# Patient Record
Sex: Male | Born: 2000 | Race: White | Hispanic: No | Marital: Single | Smoking: Former smoker
Health system: Southern US, Community
[De-identification: ages and names within clinical notes are randomized; demographics above are authoritative.]

## PROBLEM LIST (undated history)

## (undated) HISTORY — PX: SKIN GRAFT: SHX250

## (undated) HISTORY — PX: FRACTURE SURGERY: SHX138

---
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
## 2019-08-05 DIAGNOSIS — Z20822 Contact with and (suspected) exposure to covid-19: Secondary | ICD-10-CM | POA: Diagnosis not present

## 2021-09-25 DIAGNOSIS — Z5321 Procedure and treatment not carried out due to patient leaving prior to being seen by health care provider: Secondary | ICD-10-CM | POA: Diagnosis not present

## 2021-09-25 DIAGNOSIS — R109 Unspecified abdominal pain: Secondary | ICD-10-CM | POA: Diagnosis not present

## 2021-09-25 NOTE — ED Triage Notes (Signed)
Pt comes from home via POV left sided abd pain that started today. Pt states pain comes and goes. Pt started throwing up yesterday, unable to keep anything down.

## 2021-09-26 ENCOUNTER — Emergency Department
Admission: EM | Admit: 2021-09-26 | Discharge: 2021-09-26 | Disposition: A | Discharge status: Home / Self Care | Payer: Medicaid Other | Attending: Emergency Medicine | Admitting: Emergency Medicine

## 2021-09-26 LAB — COMPREHENSIVE METABOLIC PANEL
ALT: 17 U/L (ref 0–44)
AST: 25 U/L (ref 15–41)
Albumin: 5.3 g/dL — ABNORMAL HIGH (ref 3.5–5.0)
Alkaline Phosphatase: 92 U/L (ref 38–126)
Anion gap: 12 (ref 5–15)
BUN: 14 mg/dL (ref 6–20)
CO2: 28 mmol/L (ref 22–32)
Calcium: 10.3 mg/dL (ref 8.9–10.3)
Chloride: 93 mmol/L — ABNORMAL LOW (ref 98–111)
Creatinine, Ser: 0.87 mg/dL (ref 0.61–1.24)
GFR, Estimated: >60 mL/min (ref >60)
Glucose, Bld: 129 mg/dL — ABNORMAL HIGH (ref 70–99)
Potassium: 3.7 mmol/L (ref 3.5–5.1)
Sodium: 133 mmol/L — ABNORMAL LOW (ref 135–145)
Total Bilirubin: 3.3 mg/dL — ABNORMAL HIGH (ref 0.3–1.2)
Total Protein: 9.4 g/dL — ABNORMAL HIGH (ref 6.5–8.1)

## 2021-09-26 LAB — URINALYSIS, ROUTINE W REFLEX MICROSCOPIC
Bacteria, UA: NONE SEEN
Bilirubin Urine: NEGATIVE
Glucose, UA: NEGATIVE mg/dL
Ketones, ur: NEGATIVE mg/dL
Leukocytes,Ua: NEGATIVE
Nitrite: NEGATIVE
Protein, ur: 100 mg/dL — AB
Specific Gravity, Urine: 1.019 (ref 1.005–1.030)
Squamous Epithelial / LPF: NONE SEEN (ref 0–5)
pH: 6 (ref 5.0–8.0)

## 2021-09-26 LAB — CBC
HCT: 50.3 % (ref 39.0–52.0)
Hemoglobin: 17.1 g/dL — ABNORMAL HIGH (ref 13.0–17.0)
MCH: 29.6 pg (ref 26.0–34.0)
MCHC: 34 g/dL (ref 30.0–36.0)
MCV: 87.2 fL (ref 80.0–100.0)
Platelets: 383 10*3/uL (ref 150–400)
RBC: 5.77 MIL/uL (ref 4.22–5.81)
RDW: 12.8 % (ref 11.5–15.5)
WBC: 11.6 10*3/uL — ABNORMAL HIGH (ref 4.0–10.5)
nRBC: 0 % (ref 0.0–0.2)

## 2021-09-26 LAB — LIPASE, BLOOD: Lipase: 27 U/L (ref 11–51)

## 2021-09-30 ENCOUNTER — Emergency Department: Payer: Medicaid Other

## 2021-09-30 ENCOUNTER — Encounter: Payer: Self-pay | Admitting: Internal Medicine

## 2021-09-30 ENCOUNTER — Observation Stay
Admission: EM | Admit: 2021-09-30 | Discharge: 2021-10-01 | Disposition: A | Discharge status: Home / Self Care | Payer: Medicaid Other | Attending: Internal Medicine | Admitting: Internal Medicine

## 2021-09-30 ENCOUNTER — Other Ambulatory Visit: Payer: Self-pay

## 2021-09-30 DIAGNOSIS — Z20822 Contact with and (suspected) exposure to covid-19: Secondary | ICD-10-CM | POA: Insufficient documentation

## 2021-09-30 DIAGNOSIS — R935 Abnormal findings on diagnostic imaging of other abdominal regions, including retroperitoneum: Secondary | ICD-10-CM | POA: Diagnosis not present

## 2021-09-30 DIAGNOSIS — E876 Hypokalemia: Secondary | ICD-10-CM | POA: Insufficient documentation

## 2021-09-30 DIAGNOSIS — K828 Other specified diseases of gallbladder: Secondary | ICD-10-CM | POA: Diagnosis not present

## 2021-09-30 DIAGNOSIS — R17 Unspecified jaundice: Secondary | ICD-10-CM | POA: Diagnosis not present

## 2021-09-30 DIAGNOSIS — R1110 Vomiting, unspecified: Secondary | ICD-10-CM | POA: Diagnosis present

## 2021-09-30 DIAGNOSIS — R112 Nausea with vomiting, unspecified: Secondary | ICD-10-CM | POA: Diagnosis present

## 2021-09-30 DIAGNOSIS — R109 Unspecified abdominal pain: Secondary | ICD-10-CM | POA: Insufficient documentation

## 2021-09-30 DIAGNOSIS — K824 Cholesterolosis of gallbladder: Secondary | ICD-10-CM | POA: Diagnosis not present

## 2021-09-30 DIAGNOSIS — R079 Chest pain, unspecified: Secondary | ICD-10-CM | POA: Diagnosis not present

## 2021-09-30 LAB — URINALYSIS, ROUTINE W REFLEX MICROSCOPIC
Bilirubin Urine: NEGATIVE
Glucose, UA: NEGATIVE mg/dL
Hgb urine dipstick: NEGATIVE
Ketones, ur: NEGATIVE mg/dL
Leukocytes,Ua: NEGATIVE
Nitrite: NEGATIVE
Protein, ur: 100 mg/dL — AB
Specific Gravity, Urine: 1.027 (ref 1.005–1.030)
Squamous Epithelial / LPF: NONE SEEN (ref 0–5)
pH: 6 (ref 5.0–8.0)

## 2021-09-30 LAB — PROTIME-INR
INR: 1.1 (ref 0.8–1.2)
Prothrombin Time: 14.3 seconds (ref 11.4–15.2)

## 2021-09-30 LAB — CBC
HCT: 49.2 % (ref 39.0–52.0)
Hemoglobin: 17 g/dL (ref 13.0–17.0)
MCH: 29.4 pg (ref 26.0–34.0)
MCHC: 34.6 g/dL (ref 30.0–36.0)
MCV: 85.1 fL (ref 80.0–100.0)
Platelets: 407 10*3/uL — ABNORMAL HIGH (ref 150–400)
RBC: 5.78 MIL/uL (ref 4.22–5.81)
RDW: 12.1 % (ref 11.5–15.5)
WBC: 10.4 10*3/uL (ref 4.0–10.5)
nRBC: 0 % (ref 0.0–0.2)

## 2021-09-30 LAB — HEPATIC FUNCTION PANEL
ALT: 14 U/L (ref 0–44)
AST: 32 U/L (ref 15–41)
Albumin: 4.3 g/dL (ref 3.5–5.0)
Alkaline Phosphatase: 71 U/L (ref 38–126)
Bilirubin, Direct: 0.5 mg/dL — ABNORMAL HIGH (ref 0.0–0.2)
Indirect Bilirubin: 5 mg/dL — ABNORMAL HIGH (ref 0.3–0.9)
Total Bilirubin: 5.5 mg/dL — ABNORMAL HIGH (ref 0.3–1.2)
Total Protein: 7.2 g/dL (ref 6.5–8.1)

## 2021-09-30 LAB — HEPATITIS PANEL, ACUTE
HCV Ab: NONREACTIVE
Hep A IgM: NONREACTIVE
Hep B C IgM: NONREACTIVE
Hepatitis B Surface Ag: NONREACTIVE

## 2021-09-30 LAB — COMPREHENSIVE METABOLIC PANEL
ALT: 19 U/L (ref 0–44)
AST: 38 U/L (ref 15–41)
Albumin: 5.2 g/dL — ABNORMAL HIGH (ref 3.5–5.0)
Alkaline Phosphatase: 85 U/L (ref 38–126)
Anion gap: 13 (ref 5–15)
BUN: 19 mg/dL (ref 6–20)
CO2: 30 mmol/L (ref 22–32)
Calcium: 9.7 mg/dL (ref 8.9–10.3)
Chloride: 88 mmol/L — ABNORMAL LOW (ref 98–111)
Creatinine, Ser: 1.05 mg/dL (ref 0.61–1.24)
GFR, Estimated: >60 mL/min (ref >60)
Glucose, Bld: 107 mg/dL — ABNORMAL HIGH (ref 70–99)
Potassium: 2.6 mmol/L — CL (ref 3.5–5.1)
Sodium: 131 mmol/L — ABNORMAL LOW (ref 135–145)
Total Bilirubin: 6.5 mg/dL — ABNORMAL HIGH (ref 0.3–1.2)
Total Protein: 8.8 g/dL — ABNORMAL HIGH (ref 6.5–8.1)

## 2021-09-30 LAB — SARS CORONAVIRUS 2 BY RT PCR: SARS Coronavirus 2 by RT PCR: NEGATIVE

## 2021-09-30 LAB — LIPASE, BLOOD: Lipase: 43 U/L (ref 11–51)

## 2021-09-30 LAB — MAGNESIUM: Magnesium: 2.4 mg/dL (ref 1.7–2.4)

## 2021-09-30 LAB — CK: Total CK: 284 U/L (ref 49–397)

## 2021-09-30 MED ORDER — POTASSIUM CHLORIDE 10 MEQ/100ML IV SOLN
10.0000 meq | INTRAVENOUS | Status: AC
  Administered 2021-09-30 (×2): 10 meq via INTRAVENOUS
  Filled 2021-09-30 (×2): qty 100

## 2021-09-30 MED ORDER — ACETAMINOPHEN 325 MG PO TABS
650.0000 mg | ORAL_TABLET | Freq: Four times a day (QID) | ORAL | Status: DC | PRN
  Administered 2021-10-01: 650 mg via ORAL
  Filled 2021-09-30: qty 2

## 2021-09-30 MED ORDER — ACETAMINOPHEN 500 MG PO TABS
1000.0000 mg | ORAL_TABLET | Freq: Once | ORAL | Status: AC
  Administered 2021-09-30: 1000 mg via ORAL
  Filled 2021-09-30: qty 2

## 2021-09-30 MED ORDER — ONDANSETRON HCL 4 MG/2ML IJ SOLN
4.0000 mg | Freq: Four times a day (QID) | INTRAMUSCULAR | Status: DC | PRN
  Administered 2021-10-01: 4 mg via INTRAVENOUS
  Filled 2021-09-30: qty 2

## 2021-09-30 MED ORDER — SODIUM CHLORIDE 0.9 % IV BOLUS
1000.0000 mL | Freq: Once | INTRAVENOUS | Status: AC
  Administered 2021-09-30: 1000 mL via INTRAVENOUS

## 2021-09-30 MED ORDER — SODIUM CHLORIDE 0.9 % IV SOLN: INTRAVENOUS | Status: DC

## 2021-09-30 MED ORDER — ACETAMINOPHEN 650 MG RE SUPP: 650.0000 mg | Freq: Four times a day (QID) | RECTAL | Status: DC | PRN

## 2021-09-30 MED ORDER — LIDOCAINE 5 % EX PTCH
1.0000 | MEDICATED_PATCH | Freq: Once | CUTANEOUS | Status: AC
  Administered 2021-09-30: 1 via TRANSDERMAL
  Filled 2021-09-30: qty 1

## 2021-09-30 MED ORDER — POTASSIUM CHLORIDE CRYS ER 20 MEQ PO TBCR
40.0000 meq | EXTENDED_RELEASE_TABLET | Freq: Two times a day (BID) | ORAL | Status: AC
  Administered 2021-09-30 (×2): 40 meq via ORAL
  Filled 2021-09-30 (×2): qty 2

## 2021-09-30 MED ORDER — ONDANSETRON 4 MG PO TBDP
4.0000 mg | ORAL_TABLET | Freq: Once | ORAL | Status: AC
  Administered 2021-09-30: 4 mg via ORAL
  Filled 2021-09-30: qty 1

## 2021-09-30 MED ORDER — GADOBUTROL 1 MMOL/ML IV SOLN
7.0000 mL | Freq: Once | INTRAVENOUS | Status: AC | PRN
  Administered 2021-09-30: 7 mL via INTRAVENOUS

## 2021-09-30 MED ORDER — ONDANSETRON HCL 4 MG/2ML IJ SOLN: 4.0000 mg | Freq: Once | INTRAMUSCULAR | Status: DC

## 2021-09-30 MED ORDER — ONDANSETRON HCL 4 MG PO TABS: 4.0000 mg | ORAL_TABLET | Freq: Four times a day (QID) | ORAL | Status: DC | PRN

## 2021-09-30 NOTE — H&P (Signed)
History and Physical    Patient: Richard Wells VQQ:[id number] DOB: 11-12-2000 DOA: 09/30/2021 DOS: the patient was seen and examined on 09/30/2021 PCP: Pcp, No  Patient coming from: Home  Chief Complaint:  Chief Complaint  Patient presents with   Back Pain   HPI: Richard Wells is a 21 y.o. male with no significant past medical history who presented to the ER for evaluation of left flank pain.  Patient states that he woke up this morning with severe pain in the left lumbar area.  He rated his pain an 8 x 10 in intensity at its worst.  Pain was nonradiating and he denied having any hematuria, no frequency or dysuria.  He denied having any trauma.  He had 2 episodes of emesis prompting his visit to the emergency room. Patient presented to the ER about 5 days prior to this admission for evaluation of abdominal pain, nausea and vomiting but left without being seen.  His symptoms have since resolved and he attributed it to heat exhaustion. He has not had any further episodes of emesis since being in the ER.  His left flank pain has resolved.  He denies having any chest pain, no shortness of breath, no headache, no fever, no chills, no cough, no leg swelling, no blurred vision no focal deficit. Labs revealed a potassium level of 2.6, sodium of 131, chloride 88 as well as total bilirubin of 6.5 compared to 3.3, 5 days ago He will be referred to observation status for further evaluation.    Review of Systems: As mentioned in the history of present illness. All other systems reviewed and are negative. No past medical history on file. Past Surgical History:  Procedure Laterality Date   SKIN GRAFT     Social History:  reports that he has never smoked. He does not have any smokeless tobacco history on file. No history on file for alcohol use and drug use.  Allergies  Allergen Reactions   Penicillins Hives    No family history on file.  Prior to Admission medications   Not on File     Physical Exam: Vitals:   09/30/21 0712 09/30/21 1304  BP: (!) 135/110 116/71  Pulse: 92 64  Resp: 18 16  Temp: 98.5 F (36.9 C) 98.5 F (36.9 C)  TempSrc: Oral Oral  SpO2: 93% 95%  Weight: 72.6 kg   Height: 5\' 9"  (1.753 m)    Physical Exam Vitals and nursing note reviewed.  Constitutional:      Appearance: Normal appearance.     Comments: Appears comfortable and in no distress  HENT:     Head: Normocephalic.     Nose: Nose normal.     Mouth/Throat:     Mouth: Mucous membranes are moist.  Eyes:     General: Scleral icterus present.  Cardiovascular:     Rate and Rhythm: Normal rate and regular rhythm.  Pulmonary:     Effort: Pulmonary effort is normal.     Breath sounds: Normal breath sounds.  Abdominal:     General: Abdomen is flat. Bowel sounds are normal.     Palpations: Abdomen is soft.  Musculoskeletal:        General: Normal range of motion.     Cervical back: Normal range of motion and neck supple.  Skin:    General: Skin is warm and dry.  Neurological:     General: No focal deficit present.     Mental Status: He is alert and oriented  to person, place, and time.  Psychiatric:        Mood and Affect: Mood normal.        Behavior: Behavior normal.     Data Reviewed: Relevant notes from primary care and specialist visits, past discharge summaries as available in EHR, including Care Everywhere. Prior diagnostic testing as pertinent to current admission diagnoses Updated medications and problem lists for reconciliation ED course, including vitals, labs, imaging, treatment and response to treatment Triage notes, nursing and pharmacy notes and ED provider's notes Notable results as noted in HPI Labs reviewed.  Total protein 7.2, albumin 4.3, AST 32, ALT 14, alkaline phosphatase 71, total bilirubin 5.5, indirect bilirubin 5.0, total CK2 84, magnesium 2.4, lipase 43, sodium 131, potassium 2.6, chloride 88, bicarb 30, glucose 107, BUN 19, creatinine 1.05,  calcium 9.7, total protein 8.8, albumin 5.2, AST 38, ALT 19, alkaline phosphatase 85, total bilirubin 6.5, white count 10.4, hemoglobin 17, hematocrit 49.2, platelet count 4 7 Renal stone CT shows No acute or active process within the abdomen or pelvis. Chest x-ray reviewed by me shows No active cardiopulmonary disease. Liver ultrasound shows distended gallbladder containing biliary sludge. This suggests cholestasis. There is no evidence of associated biliary ductal dilatation. Elective correlation with MRI of the abdomen with and without contrast and with MRCP may be helpful given elevated bilirubin. The liver parenchyma shows no overt evidence parenchymal disease by ultrasound. Focal echogenic area in the subcapsular right lobe is suggestive of a hemangioma by ultrasound versus focal fat deposition. MRCP shows Mild distension of the gallbladder without signs of cholecystitis.Mild sludge in the gallbladder lumen. No signs of cholelithiasis or choledocholithiasis with normal caliber of the common bile duct and intrahepatic biliary tree. No acute findings in the abdomen. Twelve-lead EKG reviewed by me shows sinus rhythm with sinus arrhythmia There are no new results to review at this time.  Assessment and Plan: * Hyperbilirubinemia Unclear etiology and patient is asymptomatic ??  Secondary to Gilbert's syndrome No evidence of hemolysis as H&H is stable Continue IV fluid hydration and repeat bilirubin levels in a.m. Gastroenterology consult   Hypokalemia Most likely related to GI losses from nausea and vomiting Supplement potassium Magnesium levels are within normal limits      Advance Care Planning:   Code Status: Full Code   Consults: Gastroenterology  Family Communication: Greater than 50% of time was spent discussing patient's condition and plan of care with him at the bedside.  All questions and concerns have been addressed.  He verbalizes understanding and agrees with the  plan.  Severity of Illness: The appropriate patient status for this patient is OBSERVATION. Observation status is judged to be reasonable and necessary in order to provide the required intensity of service to ensure the patient's safety. The patient's presenting symptoms, physical exam findings, and initial radiographic and laboratory data in the context of their medical condition is felt to place them at decreased risk for further clinical deterioration. Furthermore, it is anticipated that the patient will be medically stable for discharge from the hospital within 2 midnights of admission.   Author: Lucile Shutters, MD 09/30/2021 3:22 PM  For on call review [email or web address].

## 2021-09-30 NOTE — Assessment & Plan Note (Signed)
Unclear etiology and patient is asymptomatic ??  Secondary to Gilbert's syndrome No evidence of hemolysis as H&H is stable Continue IV fluid hydration and repeat bilirubin levels in a.m. Gastroenterology consult

## 2021-09-30 NOTE — ED Notes (Signed)
Pt returned from MRI °

## 2021-09-30 NOTE — ED Provider Notes (Signed)
[facility] Provider Note    Event Date/Time   First MD Initiated Contact with Patient 09/30/21 0715     (approximate)   History   Back Pain   HPI  Richard Wells is a 21 y.o. male otherwise healthy who comes in with left flank pain.  Patient reports left flank pain since yesterday with associated nausea and vomiting.  He denies any diarrheal symptoms.  He reports that over a week ago he was out in the sun and working outside a lot but he has been drinking plenty of water since then but was not sure if this could just be heat exhaustion but denies any recurrent events of being out in the sun recently.  He denies any shortness of breath, chest pain, coughing.  Patient's oxygen level was noted to be 93% in triage but when I placed him on the O2 monitor he was 95%.  He does report history of vaping but denies any shortness of breath.  Patient denies taking anything to help with the pain today.  He did take some Advil yesterday   Physical Exam   Triage Vital Signs: ED Triage Vitals [09/30/21 0712]  Enc Vitals Group     BP (!) 135/110     Pulse Rate 92     Resp 18     Temp 98.5 F (36.9 C)     Temp Source Oral     SpO2 93 %     Weight 160 lb 0.9 oz (72.6 kg)     Height 5\' 9"  (1.753 m)     Head Circumference      Peak Flow      Pain Score 5     Pain Loc      Pain Edu?      Excl. in GC?     Most recent vital signs: Vitals:   09/30/21 0712  BP: (!) 135/110  Pulse: 92  Resp: 18  Temp: 98.5 F (36.9 C)  SpO2: 93%     General: Awake, no distress.  CV:  Good peripheral perfusion.  Resp:  Normal effort.  Abd:  No distention.  Left CVA tenderness.  Abdomen is soft and nontender otherwise Other:     ED Results / Procedures / Treatments   Labs (all labs ordered are listed, but only abnormal results are displayed) Labs Reviewed  CBC  URINALYSIS, ROUTINE W REFLEX MICROSCOPIC  COMPREHENSIVE METABOLIC PANEL  CK     EKG  My  interpretation of EKG:  Normal sinus rate of 66 without any ST elevation or T wave inversions, sinus arrhythmia, normal intervals  RADIOLOGY I have reviewed the xray personally and interpreted and no evidence of any pneumonia.  PROCEDURES:  Critical Care performed: No  Procedures   MEDICATIONS ORDERED IN ED: Medications  sodium chloride 0.9 % bolus 1,000 mL (has no administration in time range)  ondansetron (ZOFRAN) injection 4 mg (has no administration in time range)  acetaminophen (TYLENOL) tablet 1,000 mg (has no administration in time range)  lidocaine (LIDODERM) 5 % 1 patch (has no administration in time range)     IMPRESSION / MDM / ASSESSMENT AND PLAN / ED COURSE  I reviewed the triage vital signs and the nursing notes.  Differential includes UTI, pyelonephritis, kidney stone.  Will get urine and CT scan to evaluate for kidney stone given the left flank tenderness.  Patient's oxygen level noted to be 93% in triage but when I checked it it was 95%  without any interventions and within 10 minutes of initial triage.  Does report vaping history but denies any chest pain, shortness of breath..  We will add on COVID test, chest x-ray.  Will get labs to evaluate for any dehydration, rhabdo this seems less likely given the heat exposure was over a week ago.  We will treat patient with some fluids, Zofran, Tylenol, lidocaine patch   CT renal and x-ray are negative.  Labs show low sodium, low potassium and low chloride and patient's total bilirubin is elevated but has normal LFTs.  Patient's labs from 5 days ago showed a total bilirubin of 3.3 so it is doubled over the past few days.  He reports at that time he come into the emergency room for left lower quadrant abdominal pain without has since all resolved.  He denies any issues with his liver that he is noted in the past.  He denies any alcohol use other than occasional.  Denies any IV drug use.  Does report occasional marijuana use in  the past as well as vaping currently but denies any other drugs.  Urine looked amber in color but was overall reassuring no evidence of UTI but does have a lot of protein in his urine magnesium normal CK normal lipase normal  Discussed with Dr. Tobi Bastos GI who recommended MRI.  This is a little bit higher level than 1 would expect from Gilbert's disease so agree with admission for IV fluids, IV potassium and monitoring patient's bilirubin  I discussed the hospital team for admission  Patient's presentation is most consistent with acute presentation with potential threat to life or bodily function.    FINAL CLINICAL IMPRESSION(S) / ED DIAGNOSES   Final diagnoses:  Elevated bilirubin  Hypokalemia     Rx / DC Orders   ED Discharge Orders     None        Note:  This document was prepared using Dragon voice recognition software and may include unintentional dictation errors.   Concha Se, MD 09/30/21 [phone]

## 2021-09-30 NOTE — ED Triage Notes (Signed)
Pt here with back pain and possible heat exhaustion. Pt states yesterday he began having back pain from the lumbar region up. Pt states he was not out side and he has been drinking water but believes he may be dehydrated. Pt also states he has been nauseous and vomiting.

## 2021-09-30 NOTE — ED Notes (Signed)
Patient transported to MRI 

## 2021-09-30 NOTE — Assessment & Plan Note (Signed)
Most likely related to GI losses from nausea and vomiting Supplement potassium Magnesium levels are within normal limits

## 2021-10-01 DIAGNOSIS — R1032 Left lower quadrant pain: Secondary | ICD-10-CM | POA: Diagnosis not present

## 2021-10-01 DIAGNOSIS — R1110 Vomiting, unspecified: Secondary | ICD-10-CM | POA: Diagnosis present

## 2021-10-01 DIAGNOSIS — R1111 Vomiting without nausea: Secondary | ICD-10-CM

## 2021-10-01 DIAGNOSIS — E876 Hypokalemia: Secondary | ICD-10-CM

## 2021-10-01 DIAGNOSIS — R109 Unspecified abdominal pain: Secondary | ICD-10-CM | POA: Diagnosis present

## 2021-10-01 DIAGNOSIS — R17 Unspecified jaundice: Secondary | ICD-10-CM | POA: Diagnosis not present

## 2021-10-01 LAB — LACTATE DEHYDROGENASE: LDH: 91 U/L — ABNORMAL LOW (ref 98–192)

## 2021-10-01 LAB — COMPREHENSIVE METABOLIC PANEL WITH GFR
ALT: 15 U/L (ref 0–44)
AST: 23 U/L (ref 15–41)
Albumin: 3.9 g/dL (ref 3.5–5.0)
Alkaline Phosphatase: 65 U/L (ref 38–126)
Anion gap: 0 — ABNORMAL LOW (ref 5–15)
BUN: 9 mg/dL (ref 6–20)
CO2: 27 mmol/L (ref 22–32)
Calcium: 8.6 mg/dL — ABNORMAL LOW (ref 8.9–10.3)
Chloride: 108 mmol/L (ref 98–111)
Creatinine, Ser: 0.83 mg/dL (ref 0.61–1.24)
GFR, Estimated: >60 mL/min (ref >60)
Glucose, Bld: 94 mg/dL (ref 70–99)
Potassium: 3.7 mmol/L (ref 3.5–5.1)
Sodium: 135 mmol/L (ref 135–145)
Total Bilirubin: 2.9 mg/dL — ABNORMAL HIGH (ref 0.3–1.2)
Total Protein: 6.7 g/dL (ref 6.5–8.1)

## 2021-10-01 LAB — CBC
HCT: 39.8 % (ref 39.0–52.0)
Hemoglobin: 13.6 g/dL (ref 13.0–17.0)
MCH: 30.3 pg (ref 26.0–34.0)
MCHC: 34.2 g/dL (ref 30.0–36.0)
MCV: 88.6 fL (ref 80.0–100.0)
Platelets: 263 10*3/uL (ref 150–400)
RBC: 4.49 MIL/uL (ref 4.22–5.81)
RDW: 12.5 % (ref 11.5–15.5)
WBC: 7.4 10*3/uL (ref 4.0–10.5)
nRBC: 0 % (ref 0.0–0.2)

## 2021-10-01 LAB — HIV ANTIBODY (ROUTINE TESTING W REFLEX): HIV Screen 4th Generation wRfx: NONREACTIVE

## 2021-10-01 LAB — BILIRUBIN, DIRECT: Bilirubin, Direct: 0.4 mg/dL — ABNORMAL HIGH (ref 0.0–0.2)

## 2021-10-01 MED ORDER — POTASSIUM CHLORIDE IN NACL 20-0.9 MEQ/L-% IV SOLN
INTRAVENOUS | Status: DC
  Filled 2021-10-01: qty 1000

## 2021-10-01 MED ORDER — SODIUM CHLORIDE 0.9 % IV SOLN
12.5000 mg | Freq: Four times a day (QID) | INTRAVENOUS | Status: DC | PRN
  Administered 2021-10-01: 12.5 mg via INTRAVENOUS
  Filled 2021-10-01: qty 12.5

## 2021-10-01 NOTE — Discharge Summary (Addendum)
Physician Discharge Summary   Patient: Richard Wells MRN: [id number] DOB: 2000-09-12  Admit date:     09/30/2021  Discharge date: 10/01/2021  Discharge Physician: Lurene Shadow   PCP: Pcp, No   Recommendations at discharge:   Follow-up with PCP in 1 to 2 weeks  Discharge Diagnoses: Principal Problem:   Hyperbilirubinemia Active Problems:   Hypokalemia   Vomiting   Abdominal pain  Resolved Problems:   * No resolved hospital problems. [facility] Course:  Mr. Husam Hohn is a 21 year old man who recently presented to the emergency department on 09/25/2021 with nausea, vomiting and abdominal pain.  However, he left the emergency department without being seen by a physician.  He presented again to the emergency department on 09/30/2021 with abdominal pain/left flank pain and vomiting.    Work-up revealed hyperbilirubinemia and hypokalemia.  He was treated with IV fluids, analgesics and antiemetics.  Potassium was repleted successfully.  Abdominal imaging including MRCP did not show any abnormality that would explain his hyperbilirubinemia.  He was evaluated by the gastroenterologist.  No further evaluation was recommended from gastroenterologist's standpoint.  Bilirubin level has improved.  Differential diagnosis for hyperbilirubinemia include Gilbert's syndrome. Discharge plan discussed with patient and her family at the bedside.         Consultants: Gastroenterologist Procedures performed: None  Disposition: Home Diet recommendation:  Discharge Diet Orders (From admission, onward)     Start     Ordered   10/01/21 0000  Diet general        10/01/21 1505           Regular diet DISCHARGE MEDICATION: Allergies as of 10/01/2021       Reactions   Penicillins Hives        Medication List    You have not been prescribed any medications.     Discharge Exam: Filed Weights   09/30/21 5462  Weight: 72.6 kg   GEN: NAD SKIN: Warm and dry EYES: icteric, no  pallor ENT: MMM CV: RRR PULM: CTA B ABD: soft, ND, NT, +BS CNS: AAO x 3, non focal EXT: No edema or tenderness   Condition at discharge: good  The results of significant diagnostics from this hospitalization (including imaging, microbiology, ancillary and laboratory) are listed below for reference.   Imaging Studies: MR ABDOMEN MRCP W WO CONTAST  Result Date: 09/30/2021 CLINICAL DATA:  Distended gallbladder on recent ultrasound and CT imaging. EXAM: MRI ABDOMEN WITHOUT AND WITH CONTRAST (INCLUDING MRCP) TECHNIQUE: Multiplanar multisequence MR imaging of the abdomen was performed both before and after the administration of intravenous contrast. Heavily T2-weighted images of the biliary and pancreatic ducts were obtained, and three-dimensional MRCP images were rendered by post processing. CONTRAST:  7mL GADAVIST GADOBUTROL 1 MMOL/ML IV SOLN COMPARISON:  CT of September 30, 2021 and ultrasound also performed on September 30, 2021 FINDINGS: Lower chest: No effusion.  No consolidative changes. Hepatobiliary: Smooth hepatic contours. Mild distension of the gallbladder without wall thickening, pericholecystic stranding or pericholecystic fluid. Mild sludge in the gallbladder lumen. No biliary duct dilation. No signs of choledocholithiasis. No substantial fat or iron deposition in the patent parenchyma. No focal, suspicious hepatic lesion. Portal vein is patent. No correlate for the small echogenic area the RIGHT hemiliver, presumably subtle focal fat deposition. Pancreas: Normal, without mass, inflammation or ductal dilatation. MRI Spleen:  Normal. Adrenals/Urinary Tract: Adrenal glands are normal. Kidneys with symmetric renal enhancement, no hydronephrosis pack, perinephric stranding or suspicious renal lesion. Stomach/Bowel: Unremarkable to the extent  evaluated on abdominal MRI. Vascular/Lymphatic: No pathologically enlarged lymph nodes identified. No abdominal aortic aneurysm demonstrated. Other:  No  ascites. Musculoskeletal: No suspicious bone lesions identified. IMPRESSION: 1. Mild distension of the gallbladder without signs of cholecystitis. 2. Mild sludge in the gallbladder lumen. No signs of cholelithiasis or choledocholithiasis with normal caliber of the common bile duct and intrahepatic biliary tree. 3. No acute findings in the abdomen. Electronically Signed   By: Donzetta Kohut M.D.   On: 09/30/2021 14:36   MR 3D Recon At Scanner  Result Date: 09/30/2021 CLINICAL DATA:  Distended gallbladder on recent ultrasound and CT imaging. EXAM: MRI ABDOMEN WITHOUT AND WITH CONTRAST (INCLUDING MRCP) TECHNIQUE: Multiplanar multisequence MR imaging of the abdomen was performed both before and after the administration of intravenous contrast. Heavily T2-weighted images of the biliary and pancreatic ducts were obtained, and three-dimensional MRCP images were rendered by post processing. CONTRAST:  27mL GADAVIST GADOBUTROL 1 MMOL/ML IV SOLN COMPARISON:  CT of September 30, 2021 and ultrasound also performed on September 30, 2021 FINDINGS: Lower chest: No effusion.  No consolidative changes. Hepatobiliary: Smooth hepatic contours. Mild distension of the gallbladder without wall thickening, pericholecystic stranding or pericholecystic fluid. Mild sludge in the gallbladder lumen. No biliary duct dilation. No signs of choledocholithiasis. No substantial fat or iron deposition in the patent parenchyma. No focal, suspicious hepatic lesion. Portal vein is patent. No correlate for the small echogenic area the RIGHT hemiliver, presumably subtle focal fat deposition. Pancreas: Normal, without mass, inflammation or ductal dilatation. MRI Spleen:  Normal. Adrenals/Urinary Tract: Adrenal glands are normal. Kidneys with symmetric renal enhancement, no hydronephrosis pack, perinephric stranding or suspicious renal lesion. Stomach/Bowel: Unremarkable to the extent evaluated on abdominal MRI. Vascular/Lymphatic: No pathologically  enlarged lymph nodes identified. No abdominal aortic aneurysm demonstrated. Other:  No ascites. Musculoskeletal: No suspicious bone lesions identified. IMPRESSION: 1. Mild distension of the gallbladder without signs of cholecystitis. 2. Mild sludge in the gallbladder lumen. No signs of cholelithiasis or choledocholithiasis with normal caliber of the common bile duct and intrahepatic biliary tree. 3. No acute findings in the abdomen. Electronically Signed   By: Donzetta Kohut M.D.   On: 09/30/2021 14:36   US ABDOMEN LIMITED RUQ (LIVER/GB)  Result Date: 09/30/2021 CLINICAL DATA:  Elevated bilirubin. EXAM: ULTRASOUND ABDOMEN LIMITED RIGHT UPPER QUADRANT COMPARISON:  None Available. FINDINGS: Gallbladder: No gallstones or wall thickening visualized. There is some biliary sludge in the gallbladder lumen. The gallbladder is distended. No sonographic Murphy sign noted by sonographer. Common bile duct: Diameter: 4 mm Liver: Parenchymal echogenicity is within normal limits. Focal subcapsular echogenic region in the right lobe measuring approximately 1.5 x 1.2 x 1.2 cm is suggestive of hemangioma by ultrasound versus focal fat deposition. No intrahepatic biliary ductal dilatation. Portal vein is patent on color Doppler imaging with normal direction of blood flow towards the liver. Other: No ascites in the right upper quadrant. IMPRESSION: 1. Distended gallbladder containing biliary sludge. This suggests cholestasis. There is no evidence of associated biliary ductal dilatation. Elective correlation with MRI of the abdomen with and without contrast and with MRCP may be helpful given elevated bilirubin. 2. The liver parenchyma shows no overt evidence parenchymal disease by ultrasound. Focal echogenic area in the subcapsular right lobe is suggestive of a hemangioma by ultrasound versus focal fat deposition. Electronically Signed   By: Irish Lack M.D.   On: 09/30/2021 09:38   DG Chest 2 View  Result Date:  09/30/2021 CLINICAL DATA:  Left flank pain. EXAM: CHEST -  2 VIEW COMPARISON:  September 09, 2010 FINDINGS: The heart size and mediastinal contours are within normal limits. Both lungs are clear. The visualized skeletal structures are unremarkable. IMPRESSION: No active cardiopulmonary disease. Electronically Signed   By: Aram Candela M.D.   On: 09/30/2021 07:58   CT Renal Stone Study  Result Date: 09/30/2021 CLINICAL DATA:  Flank pain. EXAM: CT ABDOMEN AND PELVIS WITHOUT CONTRAST TECHNIQUE: Multidetector CT imaging of the abdomen and pelvis was performed following the standard protocol without IV contrast. RADIATION DOSE REDUCTION: This exam was performed according to the departmental dose-optimization program which includes automated exposure control, adjustment of the mA and/or kV according to patient size and/or use of iterative reconstruction technique. COMPARISON:  May 26, 2009 FINDINGS: Lower chest: No acute abnormality. Hepatobiliary: No focal liver abnormality is seen. No gallstones, gallbladder wall thickening, or biliary dilatation. Pancreas: Unremarkable. No pancreatic ductal dilatation or surrounding inflammatory changes. Spleen: Normal in size without focal abnormality. Adrenals/Urinary Tract: Adrenal glands are unremarkable. Kidneys are normal, without renal calculi, focal lesion, or hydronephrosis. Bladder is unremarkable. Stomach/Bowel: Stomach is within normal limits. Appendix appears normal. No evidence of bowel wall thickening, distention, or inflammatory changes. Vascular/Lymphatic: No significant vascular findings are present. Subcentimeter mesenteric lymph nodes are seen within the mid abdomen. Reproductive: Prostate is unremarkable. Other: No abdominal wall hernia or abnormality. No abdominopelvic ascites. Musculoskeletal: No acute or significant osseous findings. IMPRESSION: No acute or active process within the abdomen or pelvis. Electronically Signed   By: Aram Candela M.D.   On:  09/30/2021 07:57    Microbiology: Results for orders placed or performed during the hospital encounter of 09/30/21  SARS Coronavirus 2 by RT PCR (hospital order, performed in [facility] hospital lab) *cepheid single result test*     Status: None   Collection Time: 09/30/21  8:07 AM   Specimen: Nasal Swab  Result Value Ref Range Status   SARS Coronavirus 2 by RT PCR NEGATIVE NEGATIVE Final    Comment: (NOTE) SARS-CoV-2 target nucleic acids are NOT DETECTED.  The SARS-CoV-2 RNA is generally detectable in upper and lower respiratory specimens during the acute phase of infection. The lowest concentration of SARS-CoV-2 viral copies this assay can detect is 250 copies / mL. A negative result does not preclude SARS-CoV-2 infection and should not be used as the sole basis for treatment or other patient management decisions.  A negative result may occur with improper specimen collection / handling, submission of specimen other than nasopharyngeal swab, presence of viral mutation(s) within the areas targeted by this assay, and inadequate number of viral copies (<250 copies / mL). A negative result must be combined with clinical observations, patient history, and epidemiological information.  Fact Sheet for Patients:   RoadLapTop.co.za  Fact Sheet for Healthcare Providers: http://kim-miller.com/  This test is not yet approved or  cleared by the [place] FDA and has been authorized for detection and/or diagnosis of SARS-CoV-2 by FDA under an Emergency Use Authorization (EUA).  This EUA will remain in effect (meaning this test can be used) for the duration of the COVID-19 declaration under Section 564(b)(1) of the Act, 21 U.S.C. section 360bbb-3(b)(1), unless the authorization is terminated or revoked sooner.  Performed at [facility], [address] [place], [place] [address]     Labs: CBC: Recent Labs  Lab 09/25/21 2358  09/30/21 0716 10/01/21 0414  WBC 11.6* 10.4 7.4  HGB 17.1* 17.0 13.6  HCT 50.3 49.2 39.8  MCV 87.2 85.1 88.6  PLT  383 407* 263   Basic Metabolic Panel: Recent Labs  Lab 09/25/21 2358 09/30/21 0716 10/01/21 0414  NA 133* 131* 135  K 3.7 2.6* 3.7  CL 93* 88* 108  CO2 28 30 27   GLUCOSE 129* 107* 94  BUN 14 19 9   CREATININE 0.87 1.05 0.83  CALCIUM 10.3 9.7 8.6*  MG  --  2.4  --    Liver Function Tests: Recent Labs  Lab 09/25/21 2358 09/30/21 0716 09/30/21 0830 10/01/21 0414  AST 25 38 32 23  ALT 17 19 14 15   ALKPHOS 92 85 71 65  BILITOT 3.3* 6.5* 5.5* 2.9*  PROT 9.4* 8.8* 7.2 6.7  ALBUMIN 5.3* 5.2* 4.3 3.9   CBG: No results for input(s): "GLUCAP" in the last 168 hours.  Discharge time spent: less than 30 minutes.  Signed: 10/02/21, MD [facility] 10/01/2021

## 2021-10-01 NOTE — Progress Notes (Signed)
Discharge instructions reviewed with patient. PIV removed with no complications. Questions answered. Patient chose to walk out instead of a wheelchair.

## 2021-10-01 NOTE — TOC Initial Note (Signed)
Transition of Care [facility]) - Initial/Assessment Note    Patient Details  Name: Richard Wells MRN: [id number] Date of Birth: 01-23-2000  Transition of Care [facility]) CM/SW Contact:    Chapman Fitch, RN Phone Number: 10/01/2021, 10:22 AM  Clinical Narrative:                  Transition of Care [facility]) Screening Note   Patient Details  Name: Richard Wells Date of Birth: Jan 16, 2001   [facility]) CM/SW Contact:    Chapman Fitch, RN Phone Number: 10/01/2021, 10:23 AM    Transition of Care Department (TOC) has reviewed patient and no TOC needs have been identified at this time. We will continue to monitor patient advancement through interdisciplinary progression rounds. If new patient transition needs arise, please place a TOC consult.  Patient encouraged to follow up with Medicaid to determine who his assigned PCP is         Patient Goals and CMS Choice        Expected Discharge Plan and Services                                                Prior Living Arrangements/Services                       Activities of Daily Living Home Assistive Devices/Equipment: None ADL Screening (condition at time of admission) Patient's cognitive ability adequate to safely complete daily activities?: Yes Is the patient deaf or have difficulty hearing?: No Does the patient have difficulty seeing, even when wearing glasses/contacts?: No Does the patient have difficulty concentrating, remembering, or making decisions?: No Patient able to express need for assistance with ADLs?: Yes Does the patient have difficulty dressing or bathing?: No Independently performs ADLs?: Yes (appropriate for developmental age) Does the patient have difficulty walking or climbing stairs?: No Weakness of Legs: None Weakness of Arms/Hands: None  Permission Sought/Granted                  Emotional Assessment              Admission diagnosis:   Hypokalemia [E87.6] Hyperbilirubinemia [E80.6] Elevated bilirubin [R17] Patient Active Problem List   Diagnosis Date Noted   Hyperbilirubinemia 09/30/2021   Hypokalemia 09/30/2021   PCP:  Pcp, No Pharmacy:  No Pharmacies Listed    Social Determinants of Health (SDOH) Interventions    Readmission Risk Interventions     No data to display

## 2021-10-01 NOTE — Consult Note (Signed)
Wyline Mood , MD [address], [place], [place], [id number] [address], [place], [place], [address] Phone: [phone]  Fax: [phone]  Consultation  Referring Provider:     Emergency room Primary Care Physician:  Pcp, No Primary Gastroenterologist: None         Reason for Consultation:     Hyperbilirubinemia  Date of Admission:  09/30/2021 Date of Consultation:  10/01/2021         HPI:   Richard Wells is a 21 y.o. male with no past medical problems presented to the ER with left flank pain.  He came into the ER and subsequently the pain resolved spontaneously.  I was contacted because his total bilirubin was 6.5, 6 days prior was 3.3.  Subsequently declined to 5.5 and 2.9 on admission the bilirubin was moderately in direct with a direct component of 0.5 and indirect component of 5.0 out of a total of 5.5.  The patient underwent an MRCP that showed mild distention of the gallbladder without signs of cholecystitis mild sludge in the gallbladder lumen no acute findings in the abdomen no dilation of the common bile duct or intrahepatic biliary tree, right upper quadrant ultrasound showed no evidence of liver disease.  INR was 1.1.  Hepatitis panel was negative.  Albumin was 4.3.  Presently he says he has no symptoms whatsoever or no complaints whatsoever denies any excess alcohol consumption no high risk behavior no family history of liver disease no new medications or antibiotics taken recently.  No family or personal history of hematological disorders  History reviewed. No pertinent past medical history.  Past Surgical History:  Procedure Laterality Date   SKIN GRAFT      Prior to Admission medications   Not on File    No family history on file.   Social History   Tobacco Use   Smoking status: Never    Allergies as of 09/30/2021 - Review Complete 09/30/2021  Allergen Reaction Noted   Penicillins Hives 06/01/2015    Review of Systems:    All  systems reviewed and negative except where noted in HPI.   Physical Exam:  Vital signs in last 24 hours: Temp:  [98.2 F (36.8 C)-98.5 F (36.9 C)] 98.2 F (36.8 C) (09/14 0419) Pulse Rate:  [64-71] 64 (09/14 0419) Resp:  [14-18] 18 (09/14 0419) BP: (116-147)/(70-86) 147/86 (09/14 0419) SpO2:  [95 %-100 %] 99 % (09/14 0419) Last BM Date : 09/30/21 General:   Pleasant, cooperative in NAD Head:  Normocephalic and atraumatic. Eyes:   No icterus.   Conjunctiva pink. PERRLA. Ears:  Normal auditory acuity. Neck:  Supple; no masses or thyroidomegaly Lungs: Respirations even and unlabored. Lungs clear to auscultation bilaterally.   No wheezes, crackles, or rhonchi.  Heart:  Regular rate and rhythm;  Without murmur, clicks, rubs or gallops Abdomen:  Soft, nondistended, nontender. Normal bowel sounds. No appreciable masses or hepatomegaly.  No rebound or guarding.  Neurologic:  Alert and oriented x3;  grossly normal neurologically. Psych:  Alert and cooperative. Normal affect.  LAB RESULTS: Recent Labs    09/30/21 0716 10/01/21 0414  WBC 10.4 7.4  HGB 17.0 13.6  HCT 49.2 39.8  PLT 407* 263   BMET Recent Labs    09/30/21 0716 10/01/21 0414  NA 131* 135  K 2.6* 3.7  CL 88* 108  CO2 30 27  GLUCOSE 107* 94  BUN 19 9  CREATININE 1.05 0.83  CALCIUM 9.7 8.6*  LFT Recent Labs    09/30/21 0830 10/01/21 0414  PROT 7.2 6.7  ALBUMIN 4.3 3.9  AST 32 23  ALT 14 15  ALKPHOS 71 65  BILITOT 5.5* 2.9*  BILIDIR 0.5*  --   IBILI 5.0*  --    PT/INR Recent Labs    09/30/21 0830  LABPROT 14.3  INR 1.1    STUDIES: MR ABDOMEN MRCP W WO CONTAST  Result Date: 09/30/2021 CLINICAL DATA:  Distended gallbladder on recent ultrasound and CT imaging. EXAM: MRI ABDOMEN WITHOUT AND WITH CONTRAST (INCLUDING MRCP) TECHNIQUE: Multiplanar multisequence MR imaging of the abdomen was performed both before and after the administration of intravenous contrast. Heavily T2-weighted images of the  biliary and pancreatic ducts were obtained, and three-dimensional MRCP images were rendered by post processing. CONTRAST:  43mL GADAVIST GADOBUTROL 1 MMOL/ML IV SOLN COMPARISON:  CT of September 30, 2021 and ultrasound also performed on September 30, 2021 FINDINGS: Lower chest: No effusion.  No consolidative changes. Hepatobiliary: Smooth hepatic contours. Mild distension of the gallbladder without wall thickening, pericholecystic stranding or pericholecystic fluid. Mild sludge in the gallbladder lumen. No biliary duct dilation. No signs of choledocholithiasis. No substantial fat or iron deposition in the patent parenchyma. No focal, suspicious hepatic lesion. Portal vein is patent. No correlate for the small echogenic area the RIGHT hemiliver, presumably subtle focal fat deposition. Pancreas: Normal, without mass, inflammation or ductal dilatation. MRI Spleen:  Normal. Adrenals/Urinary Tract: Adrenal glands are normal. Kidneys with symmetric renal enhancement, no hydronephrosis pack, perinephric stranding or suspicious renal lesion. Stomach/Bowel: Unremarkable to the extent evaluated on abdominal MRI. Vascular/Lymphatic: No pathologically enlarged lymph nodes identified. No abdominal aortic aneurysm demonstrated. Other:  No ascites. Musculoskeletal: No suspicious bone lesions identified. IMPRESSION: 1. Mild distension of the gallbladder without signs of cholecystitis. 2. Mild sludge in the gallbladder lumen. No signs of cholelithiasis or choledocholithiasis with normal caliber of the common bile duct and intrahepatic biliary tree. 3. No acute findings in the abdomen. Electronically Signed   By: Zetta Bills M.D.   On: 09/30/2021 14:36   MR 3D Recon At Scanner  Result Date: 09/30/2021 CLINICAL DATA:  Distended gallbladder on recent ultrasound and CT imaging. EXAM: MRI ABDOMEN WITHOUT AND WITH CONTRAST (INCLUDING MRCP) TECHNIQUE: Multiplanar multisequence MR imaging of the abdomen was performed both before and  after the administration of intravenous contrast. Heavily T2-weighted images of the biliary and pancreatic ducts were obtained, and three-dimensional MRCP images were rendered by post processing. CONTRAST:  4mL GADAVIST GADOBUTROL 1 MMOL/ML IV SOLN COMPARISON:  CT of September 30, 2021 and ultrasound also performed on September 30, 2021 FINDINGS: Lower chest: No effusion.  No consolidative changes. Hepatobiliary: Smooth hepatic contours. Mild distension of the gallbladder without wall thickening, pericholecystic stranding or pericholecystic fluid. Mild sludge in the gallbladder lumen. No biliary duct dilation. No signs of choledocholithiasis. No substantial fat or iron deposition in the patent parenchyma. No focal, suspicious hepatic lesion. Portal vein is patent. No correlate for the small echogenic area the RIGHT hemiliver, presumably subtle focal fat deposition. Pancreas: Normal, without mass, inflammation or ductal dilatation. MRI Spleen:  Normal. Adrenals/Urinary Tract: Adrenal glands are normal. Kidneys with symmetric renal enhancement, no hydronephrosis pack, perinephric stranding or suspicious renal lesion. Stomach/Bowel: Unremarkable to the extent evaluated on abdominal MRI. Vascular/Lymphatic: No pathologically enlarged lymph nodes identified. No abdominal aortic aneurysm demonstrated. Other:  No ascites. Musculoskeletal: No suspicious bone lesions identified. IMPRESSION: 1. Mild distension of the gallbladder without signs of cholecystitis. 2. Mild sludge  in the gallbladder lumen. No signs of cholelithiasis or choledocholithiasis with normal caliber of the common bile duct and intrahepatic biliary tree. 3. No acute findings in the abdomen. Electronically Signed   By: Donzetta Kohut M.D.   On: 09/30/2021 14:36   US ABDOMEN LIMITED RUQ (LIVER/GB)  Result Date: 09/30/2021 CLINICAL DATA:  Elevated bilirubin. EXAM: ULTRASOUND ABDOMEN LIMITED RIGHT UPPER QUADRANT COMPARISON:  None Available. FINDINGS:  Gallbladder: No gallstones or wall thickening visualized. There is some biliary sludge in the gallbladder lumen. The gallbladder is distended. No sonographic Murphy sign noted by sonographer. Common bile duct: Diameter: 4 mm Liver: Parenchymal echogenicity is within normal limits. Focal subcapsular echogenic region in the right lobe measuring approximately 1.5 x 1.2 x 1.2 cm is suggestive of hemangioma by ultrasound versus focal fat deposition. No intrahepatic biliary ductal dilatation. Portal vein is patent on color Doppler imaging with normal direction of blood flow towards the liver. Other: No ascites in the right upper quadrant. IMPRESSION: 1. Distended gallbladder containing biliary sludge. This suggests cholestasis. There is no evidence of associated biliary ductal dilatation. Elective correlation with MRI of the abdomen with and without contrast and with MRCP may be helpful given elevated bilirubin. 2. The liver parenchyma shows no overt evidence parenchymal disease by ultrasound. Focal echogenic area in the subcapsular right lobe is suggestive of a hemangioma by ultrasound versus focal fat deposition. Electronically Signed   By: Irish Lack M.D.   On: 09/30/2021 09:38   DG Chest 2 View  Result Date: 09/30/2021 CLINICAL DATA:  Left flank pain. EXAM: CHEST - 2 VIEW COMPARISON:  September 09, 2010 FINDINGS: The heart size and mediastinal contours are within normal limits. Both lungs are clear. The visualized skeletal structures are unremarkable. IMPRESSION: No active cardiopulmonary disease. Electronically Signed   By: Aram Candela M.D.   On: 09/30/2021 07:58   CT Renal Stone Study  Result Date: 09/30/2021 CLINICAL DATA:  Flank pain. EXAM: CT ABDOMEN AND PELVIS WITHOUT CONTRAST TECHNIQUE: Multidetector CT imaging of the abdomen and pelvis was performed following the standard protocol without IV contrast. RADIATION DOSE REDUCTION: This exam was performed according to the departmental  dose-optimization program which includes automated exposure control, adjustment of the mA and/or kV according to patient size and/or use of iterative reconstruction technique. COMPARISON:  May 26, 2009 FINDINGS: Lower chest: No acute abnormality. Hepatobiliary: No focal liver abnormality is seen. No gallstones, gallbladder wall thickening, or biliary dilatation. Pancreas: Unremarkable. No pancreatic ductal dilatation or surrounding inflammatory changes. Spleen: Normal in size without focal abnormality. Adrenals/Urinary Tract: Adrenal glands are unremarkable. Kidneys are normal, without renal calculi, focal lesion, or hydronephrosis. Bladder is unremarkable. Stomach/Bowel: Stomach is within normal limits. Appendix appears normal. No evidence of bowel wall thickening, distention, or inflammatory changes. Vascular/Lymphatic: No significant vascular findings are present. Subcentimeter mesenteric lymph nodes are seen within the mid abdomen. Reproductive: Prostate is unremarkable. Other: No abdominal wall hernia or abnormality. No abdominopelvic ascites. Musculoskeletal: No acute or significant osseous findings. IMPRESSION: No acute or active process within the abdomen or pelvis. Electronically Signed   By: Aram Candela M.D.   On: 09/30/2021 07:57      Impression / Plan:   Richard Wells is a 21 y.o. y/o male presented with left lower quadrant pain that spontaneously resolved incidental finding of elevated bilirubin of a total of 6.5 predominantly indirect.  I was consulted for the same MRCP and right upper quadrant ultrasound showed no evidence of liver disease except cholelithiasis.  INR  and albumin are normal as well as a platelet count indicating no evidence of chronic liver disease.  Acute hepatitis panel is also negative.  Impression: The differential diagnosis for indirect hyperbilirubinemia includes Gilbert's syndrome, hemolysis and other conditions such as enzyme deficiencies such as Crigler-Najjar  syndrome.  Usually in Gilbert's syndrome the total bilirubin does not exceed 4.0.  Concern is if there is any coexisting hemolysis.  Plan 1.  No further evaluation from the GI point of view,  recommend ruling out hemolysis as a cause for indirect hyperbilirubinemia  I will sign off.  Please call me if any further GI concerns or questions.  We would like to thank you for the opportunity to participate in the care of Richard Wells.  Thank you for involving me in the care of this patient.      LOS: 0 days   Jonathon Bellows, MD  10/01/2021, 8:04 AM

## 2021-10-02 LAB — HAPTOGLOBIN: Haptoglobin: 86 mg/dL (ref 17–317)

## 2022-12-07 ENCOUNTER — Ambulatory Visit
Admission: RE | Admit: 2022-12-07 | Discharge: 2022-12-07 | Disposition: A | Discharge status: Home / Self Care | Payer: Medicaid Other | Source: Ambulatory Visit | Attending: Emergency Medicine | Admitting: Emergency Medicine

## 2022-12-07 VITALS — BP 113/77 | HR 84 | Temp 98.4°F | Resp 18 | Ht 69.0 in | Wt 165.0 lb

## 2022-12-07 DIAGNOSIS — J069 Acute upper respiratory infection, unspecified: Secondary | ICD-10-CM

## 2022-12-07 MED ORDER — PROMETHAZINE-DM 6.25-15 MG/5ML PO SYRP: 5.0000 mL | ORAL_SOLUTION | Freq: Every evening | ORAL | 0 refills | Status: DC | PRN

## 2022-12-07 MED ORDER — IBUPROFEN 800 MG PO TABS: 800.0000 mg | ORAL_TABLET | Freq: Three times a day (TID) | ORAL | 0 refills | Status: DC

## 2022-12-07 MED ORDER — BENZONATATE 100 MG PO CAPS: 100.0000 mg | ORAL_CAPSULE | Freq: Three times a day (TID) | ORAL | 0 refills | Status: DC

## 2022-12-26 ENCOUNTER — Emergency Department (HOSPITAL_COMMUNITY)
Admission: EM | Admit: 2022-12-26 | Discharge: 2022-12-27 | Disposition: A | Discharge status: Other Acute Inpatient Hospital | Payer: Medicaid Other | Source: Home / Self Care | Attending: Emergency Medicine | Admitting: Emergency Medicine

## 2022-12-26 ENCOUNTER — Other Ambulatory Visit: Payer: Self-pay

## 2022-12-26 ENCOUNTER — Encounter: Payer: Self-pay | Admitting: Emergency Medicine

## 2022-12-26 ENCOUNTER — Emergency Department: Payer: Medicaid Other

## 2022-12-26 DIAGNOSIS — F23 Brief psychotic disorder: Secondary | ICD-10-CM | POA: Insufficient documentation

## 2022-12-26 DIAGNOSIS — F19959 Other psychoactive substance use, unspecified with psychoactive substance-induced psychotic disorder, unspecified: Secondary | ICD-10-CM

## 2022-12-26 DIAGNOSIS — R4689 Other symptoms and signs involving appearance and behavior: Secondary | ICD-10-CM

## 2022-12-26 LAB — COMPREHENSIVE METABOLIC PANEL
ALT: 16 U/L (ref 0–44)
AST: 24 U/L (ref 15–41)
Albumin: 5 g/dL (ref 3.5–5.0)
Alkaline Phosphatase: 78 U/L (ref 38–126)
Anion gap: 12 (ref 5–15)
BUN: 20 mg/dL (ref 6–20)
CO2: 26 mmol/L (ref 22–32)
Calcium: 9.4 mg/dL (ref 8.9–10.3)
Chloride: 100 mmol/L (ref 98–111)
Creatinine, Ser: 0.9 mg/dL (ref 0.61–1.24)
GFR, Estimated: >60 mL/min (ref >60)
Glucose, Bld: 150 mg/dL — ABNORMAL HIGH (ref 70–99)
Potassium: 3.7 mmol/L (ref 3.5–5.1)
Sodium: 138 mmol/L (ref 135–145)
Total Bilirubin: 2 mg/dL — ABNORMAL HIGH (ref <1.2)
Total Protein: 8.9 g/dL — ABNORMAL HIGH (ref 6.5–8.1)

## 2022-12-26 LAB — CBC
HCT: 43.1 % (ref 39.0–52.0)
Hemoglobin: 14.4 g/dL (ref 13.0–17.0)
MCH: 30.4 pg (ref 26.0–34.0)
MCHC: 33.4 g/dL (ref 30.0–36.0)
MCV: 91.1 fL (ref 80.0–100.0)
Platelets: 376 10*3/uL (ref 150–400)
RBC: 4.73 MIL/uL (ref 4.22–5.81)
RDW: 12.7 % (ref 11.5–15.5)
WBC: 16.5 10*3/uL — ABNORMAL HIGH (ref 4.0–10.5)
nRBC: 0 % (ref 0.0–0.2)

## 2022-12-26 LAB — URINE DRUG SCREEN, QUALITATIVE (ARMC ONLY)
Amphetamines, Ur Screen: NOT DETECTED
Barbiturates, Ur Screen: NOT DETECTED
Benzodiazepine, Ur Scrn: NOT DETECTED
Cannabinoid 50 Ng, Ur ~~LOC~~: POSITIVE — AB
Cocaine Metabolite,Ur ~~LOC~~: NOT DETECTED
MDMA (Ecstasy)Ur Screen: NOT DETECTED
Methadone Scn, Ur: NOT DETECTED
Opiate, Ur Screen: NOT DETECTED
Phencyclidine (PCP) Ur S: NOT DETECTED
Tricyclic, Ur Screen: NOT DETECTED

## 2022-12-26 LAB — ETHANOL: Alcohol, Ethyl (B): <10 mg/dL (ref <10)

## 2022-12-26 LAB — ACETAMINOPHEN LEVEL: Acetaminophen (Tylenol), Serum: <10 ug/mL — ABNORMAL LOW (ref 10–30)

## 2022-12-26 LAB — SALICYLATE LEVEL: Salicylate Lvl: <7 mg/dL — ABNORMAL LOW (ref 7.0–30.0)

## 2022-12-26 MED ORDER — LORAZEPAM 2 MG/ML IJ SOLN: 1.0000 mg | Freq: Once | INTRAMUSCULAR | Status: DC

## 2022-12-26 MED ORDER — DIPHENHYDRAMINE HCL 50 MG/ML IJ SOLN
25.0000 mg | Freq: Once | INTRAMUSCULAR | Status: AC
  Administered 2022-12-26: 25 mg via INTRAMUSCULAR
  Filled 2022-12-26: qty 1

## 2022-12-26 MED ORDER — LORAZEPAM 2 MG/ML IJ SOLN
2.0000 mg | Freq: Once | INTRAMUSCULAR | Status: AC
  Administered 2022-12-26: 2 mg via INTRAMUSCULAR
  Filled 2022-12-26: qty 1

## 2022-12-26 MED ORDER — HALOPERIDOL LACTATE 5 MG/ML IJ SOLN
5.0000 mg | Freq: Once | INTRAMUSCULAR | Status: AC
  Administered 2022-12-26: 5 mg via INTRAMUSCULAR
  Filled 2022-12-26: qty 1

## 2022-12-26 NOTE — ED Provider Notes (Signed)
11:37 PM  Assumed care.  Patient here with altered mental status for the past 24 hours.  Per family he has been using a synthetic cannabinoid but has not used it in over 24 hours and continues to have odd behavior.  Patient received sedation earlier and is now sleeping.  He is currently here voluntarily.  Psych consultation pending.  12:41 AM  I was informed by nursing staff that mother wanted to take patient home and nurse gave them an AMA form and they walked out of the department with the patient.  Nursing staff was able to stop patient from leaving.  Mother was upset that he will not be reassessed by psychiatry until the morning when he is awake.  At this time patient is not safe to be discharged until his mental status has improved.  We are concerned that this could be from substances but also could be new onset psychosis.  I will place him under involuntary commitment.  Psychiatric disposition pending.     Juan Olthoff, Layla Maw, DO 12/27/22 [phone]

## 2022-12-27 ENCOUNTER — Encounter: Payer: Self-pay | Admitting: Psychiatry

## 2022-12-27 ENCOUNTER — Other Ambulatory Visit: Payer: Self-pay

## 2022-12-27 ENCOUNTER — Inpatient Hospital Stay
Admission: AD | Admit: 2022-12-27 | Discharge: 2022-12-31 | DRG: 880 | Disposition: A | Discharge status: Home / Self Care | Payer: Medicaid Other | Source: Intra-hospital | Attending: Psychiatry | Admitting: Psychiatry

## 2022-12-27 DIAGNOSIS — Z79899 Other long term (current) drug therapy: Secondary | ICD-10-CM | POA: Diagnosis not present

## 2022-12-27 DIAGNOSIS — F19959 Other psychoactive substance use, unspecified with psychoactive substance-induced psychotic disorder, unspecified: Secondary | ICD-10-CM | POA: Diagnosis not present

## 2022-12-27 DIAGNOSIS — R569 Unspecified convulsions: Secondary | ICD-10-CM | POA: Diagnosis not present

## 2022-12-27 DIAGNOSIS — F329 Major depressive disorder, single episode, unspecified: Secondary | ICD-10-CM | POA: Diagnosis not present

## 2022-12-27 DIAGNOSIS — Z880 Allergy status to penicillin: Secondary | ICD-10-CM

## 2022-12-27 DIAGNOSIS — F12159 Cannabis abuse with psychotic disorder, unspecified: Secondary | ICD-10-CM | POA: Diagnosis present

## 2022-12-27 DIAGNOSIS — F17290 Nicotine dependence, other tobacco product, uncomplicated: Secondary | ICD-10-CM | POA: Diagnosis not present

## 2022-12-27 DIAGNOSIS — F1210 Cannabis abuse, uncomplicated: Secondary | ICD-10-CM | POA: Insufficient documentation

## 2022-12-27 DIAGNOSIS — F411 Generalized anxiety disorder: Principal | ICD-10-CM | POA: Diagnosis present

## 2022-12-27 MED ORDER — MAGNESIUM HYDROXIDE 400 MG/5ML PO SUSP: 30.0000 mL | Freq: Every day | ORAL | Status: DC | PRN

## 2022-12-27 MED ORDER — ACETAMINOPHEN 325 MG PO TABS: 650.0000 mg | ORAL_TABLET | Freq: Four times a day (QID) | ORAL | Status: DC | PRN

## 2022-12-27 MED ORDER — TRAZODONE HCL 50 MG PO TABS
50.0000 mg | ORAL_TABLET | Freq: Every evening | ORAL | Status: DC | PRN
  Administered 2022-12-28: 50 mg via ORAL
  Filled 2022-12-27: qty 1

## 2022-12-27 MED ORDER — DIPHENHYDRAMINE HCL 50 MG/ML IJ SOLN: 50.0000 mg | Freq: Three times a day (TID) | INTRAMUSCULAR | Status: DC | PRN

## 2022-12-27 MED ORDER — ALUM & MAG HYDROXIDE-SIMETH 200-200-20 MG/5ML PO SUSP
30.0000 mL | ORAL | Status: DC | PRN
  Administered 2022-12-31: 30 mL via ORAL
  Filled 2022-12-27: qty 30

## 2022-12-27 MED ORDER — LORAZEPAM 2 MG/ML IJ SOLN: 2.0000 mg | Freq: Three times a day (TID) | INTRAMUSCULAR | Status: DC | PRN

## 2022-12-27 MED ORDER — HALOPERIDOL LACTATE 5 MG/ML IJ SOLN: 10.0000 mg | Freq: Three times a day (TID) | INTRAMUSCULAR | Status: DC | PRN

## 2022-12-27 MED ORDER — OLANZAPINE 5 MG PO TABS
2.5000 mg | ORAL_TABLET | Freq: Two times a day (BID) | ORAL | Status: DC
  Administered 2022-12-27 – 2022-12-29 (×4): 2.5 mg via ORAL
  Filled 2022-12-27 (×4): qty 1

## 2022-12-27 MED ORDER — HALOPERIDOL 5 MG PO TABS: 5.0000 mg | ORAL_TABLET | Freq: Three times a day (TID) | ORAL | Status: DC | PRN

## 2022-12-27 MED ORDER — HYDROXYZINE HCL 25 MG PO TABS
25.0000 mg | ORAL_TABLET | Freq: Three times a day (TID) | ORAL | Status: DC | PRN
  Administered 2022-12-29: 25 mg via ORAL
  Filled 2022-12-27: qty 1

## 2022-12-27 MED ORDER — OLANZAPINE 5 MG PO TABS
2.5000 mg | ORAL_TABLET | Freq: Two times a day (BID) | ORAL | Status: DC
  Administered 2022-12-27: 2.5 mg via ORAL
  Filled 2022-12-27: qty 1

## 2022-12-27 MED ORDER — DIPHENHYDRAMINE HCL 25 MG PO CAPS: 50.0000 mg | ORAL_CAPSULE | Freq: Three times a day (TID) | ORAL | Status: DC | PRN

## 2022-12-27 MED ORDER — HALOPERIDOL LACTATE 5 MG/ML IJ SOLN: 5.0000 mg | Freq: Three times a day (TID) | INTRAMUSCULAR | Status: DC | PRN
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
## 2022-12-28 DIAGNOSIS — F1210 Cannabis abuse, uncomplicated: Secondary | ICD-10-CM | POA: Insufficient documentation

## 2022-12-28 DIAGNOSIS — F19959 Other psychoactive substance use, unspecified with psychoactive substance-induced psychotic disorder, unspecified: Secondary | ICD-10-CM | POA: Diagnosis not present

## 2022-12-28 DIAGNOSIS — F329 Major depressive disorder, single episode, unspecified: Secondary | ICD-10-CM | POA: Insufficient documentation

## 2022-12-28 MED ORDER — ESCITALOPRAM OXALATE 10 MG PO TABS
5.0000 mg | ORAL_TABLET | Freq: Every day | ORAL | Status: DC
  Administered 2022-12-28 – 2022-12-31 (×4): 5 mg via ORAL
  Filled 2022-12-28 (×4): qty 1

## 2022-12-28 MED ORDER — NICOTINE 14 MG/24HR TD PT24
14.0000 mg | MEDICATED_PATCH | Freq: Every day | TRANSDERMAL | Status: DC
  Administered 2022-12-28 – 2022-12-30 (×3): 14 mg via TRANSDERMAL
  Filled 2022-12-28 (×4): qty 1

## 2022-12-28 MED FILL — Nicotine Polacrilex Gum 2 MG: 2.0000 mg | OROMUCOSAL | Qty: 1 | Status: AC

## 2022-12-29 DIAGNOSIS — F411 Generalized anxiety disorder: Secondary | ICD-10-CM | POA: Diagnosis not present

## 2022-12-29 MED ORDER — MELATONIN 5 MG PO TABS
5.0000 mg | ORAL_TABLET | Freq: Every day | ORAL | Status: DC
  Administered 2022-12-29 – 2022-12-30 (×2): 5 mg via ORAL
  Filled 2022-12-29 (×2): qty 1

## 2022-12-29 MED ADMIN — Nicotine Polacrilex Gum 2 MG: 2 mg | ORAL | NDC 00135015707

## 2022-12-29 MED FILL — Olanzapine Tab 5 MG: 5.0000 mg | ORAL | Qty: 1 | Status: AC

## 2022-12-30 DIAGNOSIS — F411 Generalized anxiety disorder: Secondary | ICD-10-CM | POA: Diagnosis not present

## 2022-12-30 LAB — CBC WITH DIFFERENTIAL/PLATELET
Abs Immature Granulocytes: 0.05 10*3/uL (ref 0.00–0.07)
Basophils Absolute: 0 10*3/uL (ref 0.0–0.1)
Basophils Relative: 0 %
Eosinophils Absolute: 0 10*3/uL (ref 0.0–0.5)
Eosinophils Relative: 0 %
HCT: 42.2 % (ref 39.0–52.0)
Hemoglobin: 14.5 g/dL (ref 13.0–17.0)
Immature Granulocytes: 0 %
Lymphocytes Relative: 21 %
Lymphs Abs: 2.5 10*3/uL (ref 0.7–4.0)
MCH: 30 pg (ref 26.0–34.0)
MCHC: 34.4 g/dL (ref 30.0–36.0)
MCV: 87.4 fL (ref 80.0–100.0)
Monocytes Absolute: 0.8 10*3/uL (ref 0.1–1.0)
Monocytes Relative: 6 %
Neutro Abs: 8.4 10*3/uL — ABNORMAL HIGH (ref 1.7–7.7)
Neutrophils Relative %: 73 %
Platelets: 348 10*3/uL (ref 150–400)
RBC: 4.83 MIL/uL (ref 4.22–5.81)
RDW: 12.4 % (ref 11.5–15.5)
WBC: 11.8 10*3/uL — ABNORMAL HIGH (ref 4.0–10.5)
nRBC: 0 % (ref 0.0–0.2)

## 2022-12-30 LAB — LIPID PANEL
Cholesterol: 133 mg/dL (ref 0–200)
HDL: 37 mg/dL — ABNORMAL LOW (ref >40)
LDL Cholesterol: 84 mg/dL (ref 0–99)
Total CHOL/HDL Ratio: 3.6 {ratio}
Triglycerides: 58 mg/dL (ref <150)
VLDL: 12 mg/dL (ref 0–40)

## 2022-12-30 MED ADMIN — Olanzapine Tab 5 MG: 5 mg | ORAL | NDC 00002411530

## 2022-12-31 ENCOUNTER — Other Ambulatory Visit: Payer: Self-pay

## 2022-12-31 DIAGNOSIS — F411 Generalized anxiety disorder: Principal | ICD-10-CM | POA: Diagnosis present

## 2022-12-31 MED ORDER — OLANZAPINE 5 MG PO TABS
5.0000 mg | ORAL_TABLET | Freq: Every day | ORAL | 0 refills | Status: AC
  Filled 2022-12-31: qty 30, 30d supply, fill #0

## 2022-12-31 MED ORDER — ONDANSETRON 4 MG PO TBDP
4.0000 mg | ORAL_TABLET | Freq: Three times a day (TID) | ORAL | Status: DC | PRN
  Administered 2022-12-31: 4 mg via ORAL
  Filled 2022-12-31: qty 1

## 2022-12-31 MED ORDER — ESCITALOPRAM OXALATE 5 MG PO TABS
5.0000 mg | ORAL_TABLET | Freq: Every day | ORAL | 0 refills | Status: AC
  Filled 2022-12-31: qty 30, 30d supply, fill #0

## 2022-12-31 MED ORDER — TRAZODONE HCL 50 MG PO TABS
50.0000 mg | ORAL_TABLET | Freq: Every evening | ORAL | 0 refills | Status: AC | PRN
  Filled 2022-12-31: qty 30, 30d supply, fill #0

## 2022-12-31 MED ORDER — ONDANSETRON 4 MG PO TBDP
4.0000 mg | ORAL_TABLET | Freq: Three times a day (TID) | ORAL | 0 refills | Status: AC | PRN
  Filled 2022-12-31: qty 20, 7d supply, fill #0

## 2022-12-31 MED ORDER — HYDROXYZINE HCL 25 MG PO TABS
25.0000 mg | ORAL_TABLET | Freq: Three times a day (TID) | ORAL | 0 refills | Status: AC | PRN
  Filled 2022-12-31: qty 30, 10d supply, fill #0

## 2022-12-31 MED ORDER — NICOTINE POLACRILEX 2 MG MT GUM
2.0000 mg | CHEWING_GUM | OROMUCOSAL | 0 refills | Status: AC
  Filled 2022-12-31: qty 100, 20d supply, fill #0

## 2023-01-03 ENCOUNTER — Encounter: Payer: Self-pay | Admitting: Internal Medicine

## 2023-01-20 ENCOUNTER — Other Ambulatory Visit: Payer: Self-pay | Admitting: Licensed Clinical Social Worker

## 2023-01-20 NOTE — Patient Outreach (Signed)
  Medicaid Managed Care   Unsuccessful Attempt Note   01/20/2023 Name: Richard Wells MRN: [id number] DOB: Jul 02, 2000  Referred by: Patient, No Pcp Per Reason for referral : No chief complaint on file.   An unsuccessful telephone outreach was attempted today. The patient was referred to the case management team for assistance with care management and care coordination.    Follow Up Plan: A HIPAA compliant phone message was left for the patient providing contact information and requesting a return call.   Lyle Rung, BSW, MSW, LCSW Licensed Clinical Social Worker [employer]   [email or web address] Direct Dial: [phone]

## 2023-01-20 NOTE — Patient Instructions (Signed)
 Richard Wells ,   The [facility] Managed Care Team is available to provide assistance to you with your healthcare needs at no cost and as a benefit of your [facility] Health plan. I'm sorry I was unable to reach you today for our scheduled appointment. Our care guide will call you to reschedule our telephone appointment. Please call me at the number below. I am available to be of assistance to you regarding your healthcare needs. .   Thank you,   Lyle Rung, BSW, MSW, LCSW Licensed Clinical Social Worker [employer]   [facility] [email or web address] Direct Dial: [phone]

## 2023-02-04 ENCOUNTER — Telehealth: Payer: Self-pay

## 2023-02-04 NOTE — Progress Notes (Signed)
..   Medicaid Managed Care   Unsuccessful Outreach Note  02/04/2023 Name: Richard Wells MRN: [id number] DOB: 11/06/2000  Referred by: Patient, No Pcp Per Reason for referral : High Risk Managed Medicaid (I reached out to the patient today to get his missed phone appt with the MM LCSW rescheduled. I had to leave a message on his VM with my name and call back number.)   A second unsuccessful telephone outreach was attempted today. The patient was referred to the case management team for assistance with care management and care coordination.   Follow Up Plan: A HIPAA compliant phone message was left for the patient providing contact information and requesting a return call.  The care management team will reach out to the patient again over the next 7 days.   Weston Settle [facility], [facility] Guide Direct Dial: [phone]  Fax: [phone]

## 2023-02-28 ENCOUNTER — Telehealth: Payer: Self-pay

## 2023-02-28 NOTE — Progress Notes (Signed)
..   Medicaid Managed Care   Unsuccessful Outreach Note  02/28/2023 Name: Richard Wells MRN: [id number] DOB: 25-Jul-2000  Referred by: Patient, No Pcp Per Reason for referral : High Risk Managed Medicaid   Third unsuccessful telephone outreach was attempted today. The patient was referred to the case management team for assistance with care management and care coordination. The patient's primary care provider has been notified of our unsuccessful attempts to make or maintain contact with the patient. The care management team is pleased to engage with this patient at any time in the future should he/she be interested in assistance from the care management team.   Follow Up Plan: We have been unable to make contact with the patient for follow up. The care management team is available to follow up with the patient after provider conversation with the patient regarding recommendation for care management engagement and subsequent re-referral to the care management team.   Emily Harding [facility], [facility] Guide Direct Dial: [phone]  Fax: [phone]

## 2023-07-06 ENCOUNTER — Telehealth: Payer: Self-pay | Admitting: *Deleted

## 2023-07-06 NOTE — Progress Notes (Signed)
 Complex Care Management Note Care Guide Note  07/06/2023 Name: Richard Wells MRN: [id number] DOB: 2000/08/18   Complex Care Management Outreach Attempts: An unsuccessful telephone outreach was attempted today to offer the patient information about available complex care management services.  Follow Up Plan:  Additional outreach attempts will be made to offer the patient complex care management information and services.   Encounter Outcome:  No Answer  Kandis Ormond, CMA [place]  [facility], [facility] Guide Direct Dial: [phone]  Fax: [phone] Website: [email or web address]

## 2023-07-07 NOTE — Progress Notes (Signed)
 Complex Care Management Note Care Guide Note  07/07/2023 Name: Richard Wells MRN: [id number] DOB: 2000/03/24   Complex Care Management Outreach Attempts: A second unsuccessful outreach was attempted today to offer the patient with information about available complex care management services.  Follow Up Plan:  Additional outreach attempts will be made to offer the patient complex care management information and services.   Encounter Outcome:  No Answer  Kandis Ormond, CMA [place]  [facility], [facility] Guide Direct Dial: [phone]  Fax: [phone] Website: [email or web address]

## 2023-07-08 NOTE — Progress Notes (Signed)
 Complex Care Management Note Care Guide Note  07/08/2023 Name: Richard Wells MRN: [id number] DOB: 10-06-2000   Complex Care Management Outreach Attempts: A third unsuccessful outreach was attempted today to offer the patient with information about available complex care management services.  Follow Up Plan:  No further outreach attempts will be made at this time. We have been unable to contact the patient to offer or enroll patient in complex care management services.  Encounter Outcome:  No Answer  Kandis Ormond, CMA [place]  [facility], [facility] Guide Direct Dial: [phone]  Fax: [phone] Website: [email or web address]

## 2023-08-08 ENCOUNTER — Ambulatory Visit: Admission: RE | Admit: 2023-08-08 | Discharge: 2023-08-08 | Disposition: A | Discharge status: Home / Self Care

## 2023-08-08 VITALS — BP 130/80 | HR 74 | Temp 97.7°F | Resp 18

## 2023-08-08 DIAGNOSIS — R197 Diarrhea, unspecified: Secondary | ICD-10-CM

## 2023-08-08 NOTE — Discharge Instructions (Signed)
 Try taking a dose of Imodium to help with your symptoms make sure you are drinking plenty of fluids drink a 32 ounce Gatorade and 32 ounces of water today.  Limit solid intake for the next 12 hours rice, applesauce, toast, crackers and dry Pasta.  Go to the emergency department if abdominal pain worsens.

## 2023-08-08 NOTE — ED Triage Notes (Signed)
 Patient to Urgent Care with complaints of intermittent, abdominal cramping and diarrhea. Denies any NV.  Symptoms started Saturday morning.  Taking pepto w/ little relief in symptoms.

## 2023-08-08 NOTE — ED Provider Notes (Signed)
 Richard Wells    CSN: [id number] Arrival date & time: 08/08/23  0850      History   Chief Complaint Chief Complaint  Patient presents with   Diarrhea    Entered by patient    HPI Richard Wells is a 23 y.o. male.   Patient complains of diarrhea since Saturday.  Patient reports that he thought he was getting better but had another episode of diarrhea today.  Patient reports he has not had any vomiting.  He has not had any fever or chills patient denies any chest pain.  Patient denies any shortness of breath.  He has not been exposed to anyone with viral illness that he is aware of.  He does not have any history of soup suspicious food intake.  Patient denies any medical problems.  The history is provided by the patient. No language interpreter was used.  Diarrhea   History reviewed. No pertinent past medical history.  Patient Active Problem List   Diagnosis Date Noted   Generalized anxiety disorder 12/31/2022   Cannabis abuse 12/28/2022   Major depression 12/28/2022   Abdominal pain 10/01/2021   Vomiting    Hyperbilirubinemia 09/30/2021   Hypokalemia 09/30/2021    Past Surgical History:  Procedure Laterality Date   FRACTURE SURGERY     SKIN GRAFT         Home Medications    Prior to Admission medications   Medication Sig Start Date End Date Taking? Authorizing Provider  escitalopram  (LEXAPRO ) 5 MG tablet Take 1 tablet (5 mg total) by mouth daily. Patient not taking: Reported on 08/08/2023 01/01/23 01/31/23  Nicholaus Brad RAMAN, NP  nicotine  polacrilex (NICORETTE ) 2 MG gum Take 1 each (2 mg total) by mouth every 4 (four) hours while awake. Patient not taking: Reported on 08/08/2023 12/31/22   Nicholaus Brad RAMAN, NP  OLANZapine  (ZYPREXA ) 5 MG tablet Take 1 tablet (5 mg total) by mouth at bedtime. Patient not taking: Reported on 08/08/2023 12/31/22 01/30/23  Nicholaus Brad RAMAN, NP  traZODone  (DESYREL ) 50 MG tablet Take 1 tablet (50 mg total) by mouth at bedtime as needed  for sleep. Patient not taking: Reported on 08/08/2023 12/31/22 01/30/23  Nicholaus Brad RAMAN, NP    Family History History reviewed. No pertinent family history.  Social History Social History   Tobacco Use   Smoking status: Former    Types: E-cigarettes   Smokeless tobacco: Never  Vaping Use   Vaping status: Former  Substance Use Topics   Alcohol use: Never   Drug use: Yes    Types: Marijuana     Allergies   Penicillins and Penicillins   Review of Systems Review of Systems  Gastrointestinal:  Positive for diarrhea.  All other systems reviewed and are negative.    Physical Exam Triage Vital Signs ED Triage Vitals [08/08/23 0919]  Encounter Vitals Group     BP      Girls Systolic BP Percentile      Girls Diastolic BP Percentile      Boys Systolic BP Percentile      Boys Diastolic BP Percentile      Pulse      Resp      Temp      Temp src      SpO2      Weight      Height      Head Circumference      Peak Flow      Pain Score 5  Pain Loc      Pain Education      Exclude from Growth Chart    No data found.  Updated Vital Signs There were no vitals taken for this visit.  Visual Acuity Right Eye Distance:   Left Eye Distance:   Bilateral Distance:    Right Eye Near:   Left Eye Near:    Bilateral Near:     Physical Exam Vitals and nursing note reviewed.  Constitutional:      Appearance: He is well-developed.  HENT:     Head: Normocephalic.  Cardiovascular:     Rate and Rhythm: Normal rate.  Pulmonary:     Effort: Pulmonary effort is normal.  Abdominal:     General: There is no distension.  Musculoskeletal:        General: Normal range of motion.     Cervical back: Normal range of motion.  Skin:    General: Skin is warm.  Neurological:     General: No focal deficit present.     Mental Status: He is alert and oriented to person, place, and time.      UC Treatments / Results  Labs (all labs ordered are listed, but only abnormal  results are displayed) Labs Reviewed - No data to display  EKG   Radiology No results found.  Procedures Procedures (including critical care time)  Medications Ordered in UC Medications - No data to display  Initial Impression / Assessment and Plan / UC Course  I have reviewed the triage vital signs and the nursing notes.  Pertinent labs & imaging results that were available during my care of the patient were reviewed by me and considered in my medical decision making (see chart for details).     Patient's symptoms most consistent with viral illness.  Patient is advised to try Imodium he is encouraged to drink plenty of fluids  He is advised to go to the emergency department if he has abdominal pain or worsening symptoms. Final Clinical Impressions(s) / UC Diagnoses   Final diagnoses:  Diarrhea, unspecified type     Discharge Instructions      Try taking a dose of Imodium to help with your symptoms make sure you are drinking plenty of fluids drink a 32 ounce Gatorade and 32 ounces of water today.  Limit solid intake for the next 12 hours rice, applesauce, toast, crackers and dry Pasta.  Go to the emergency department if abdominal pain worsens.   ED Prescriptions   None    PDMP not reviewed this encounter. An After Visit Summary was printed and given to the patient.       Flint Sonny POUR, PA-C 08/08/23 [phone]

## 2023-11-25 ENCOUNTER — Ambulatory Visit: Payer: Self-pay

## 2024-01-27 ENCOUNTER — Other Ambulatory Visit: Payer: Self-pay

## 2024-01-27 ENCOUNTER — Emergency Department
Admission: EM | Admit: 2024-01-27 | Discharge: 2024-01-27 | Disposition: A | Discharge status: Home / Self Care | Attending: Emergency Medicine | Admitting: Emergency Medicine

## 2024-01-27 ENCOUNTER — Emergency Department

## 2024-01-27 DIAGNOSIS — S4992XA Unspecified injury of left shoulder and upper arm, initial encounter: Secondary | ICD-10-CM | POA: Diagnosis present

## 2024-01-27 DIAGNOSIS — S46912A Strain of unspecified muscle, fascia and tendon at shoulder and upper arm level, left arm, initial encounter: Secondary | ICD-10-CM | POA: Insufficient documentation

## 2024-01-27 DIAGNOSIS — X500XXA Overexertion from strenuous movement or load, initial encounter: Secondary | ICD-10-CM | POA: Insufficient documentation

## 2024-01-27 MED ORDER — NAPROXEN 500 MG PO TABS: 500.0000 mg | ORAL_TABLET | Freq: Two times a day (BID) | ORAL | 0 refills | Status: AC

## 2024-01-27 MED ORDER — METHOCARBAMOL 500 MG PO TABS: 500.0000 mg | ORAL_TABLET | Freq: Three times a day (TID) | ORAL | 0 refills | Status: AC | PRN

## 2024-01-27 NOTE — ED Provider Notes (Signed)
" ° °  [facility] Provider Note    Event Date/Time   First MD Initiated Contact with Patient 01/27/24 [id number]     (approximate)   History   Arm Injury   HPI  Richard Wells is a 24 y.o. male  with no contributing past medical history and as listed in EMR presents to the emergency department for evaluation of left upper arm pain that began 4 to 5 days ago after moving furniture.  Pain increases with movement.  No relief with IcyHot and Tylenol .      Physical Exam    Vitals:   01/27/24 0744  BP: (!) 138/95  Pulse: 93  Resp: 16  Temp: 98.2 F (36.8 C)  SpO2: 97%    General: Awake, no distress.  CV:  Good peripheral perfusion.  Resp:  Normal effort.  Abd:  No distention.  Other:  Pain in mid upper arm increases with abduction, internal or external rotation of the shoulder.   ED Results / Procedures / Treatments   Labs (all labs ordered are listed, but only abnormal results are displayed)  Labs Reviewed - No data to display   EKG  Not indicated.   RADIOLOGY  Image and radiology report reviewed and interpreted by me. Radiology report consistent with the same.  X-ray of the left humerus negative for acute bony abnormality.  PROCEDURES:  Critical Care performed: No  Procedures   MEDICATIONS ORDERED IN ED:  Medications - No data to display   IMPRESSION / MDM / ASSESSMENT AND PLAN / ED COURSE   I have reviewed the triage note and vital signs. Vital signs stable.   Differential diagnosis includes, but is not limited to, muscle strain, humeral head fracture, humerus shaft fracture  Patient's presentation is most consistent with acute illness / injury with system symptoms.  24 year old male presenting to the emergency department for treatment and evaluation of left upper arm pain after moving furniture a few days ago.  See HPI for further details.  Exam is most consistent with muscle strain.  Significant other in the room was  concerned that there may be a fracture.  This is unlikely however imaging of the humerus has been ordered.  Clinical Course as of 01/27/24 0849  [place] Jan 27, 2024  0848 X-ray negative for acute bony abnormality.  Results reviewed with the patient and significant other.  Plan will be to prescribe methocarbamol  and Naprosyn .  He was advised to follow-up with primary care if symptoms or not improving over the week. [CT]    Clinical Course User Index [CT] Kenadee Gates B, FNP     FINAL CLINICAL IMPRESSION(S) / ED DIAGNOSES   Final diagnoses:  Muscle strain of left upper arm, initial encounter     Rx / DC Orders   ED Discharge Orders          Ordered    methocarbamol  (ROBAXIN ) 500 MG tablet  Every 8 hours PRN        01/27/24 0847    naproxen  (NAPROSYN ) 500 MG tablet  2 times daily with meals        01/27/24 0847             Note:  This document was prepared using Dragon voice recognition software and may include unintentional dictation errors.   Herlinda Kirk NOVAK, FNP 01/27/24 9150    Suzanne Kirsch, MD 02/03/24 [phone]  "

## 2024-01-27 NOTE — ED Triage Notes (Signed)
 Pt ambulatory to ED from home C/O L shoulder/arm injury several days ago while moving furniture. Reports difficulty moving arm. Able to move fingers. Radial pulse +3, cap refill <3 sec

## 2024-01-27 NOTE — ED Notes (Signed)
 See triage note  Presents with left shoulder and arm pain  Denies any fall   But stated he did move some furniture  No deformity noted
